# Patient Record
Sex: Female | Born: 1980 | Marital: Single | Smoking: Never smoker
Health system: Southern US, Community
[De-identification: ages and names within clinical notes are randomized; demographics above are authoritative.]

## PROBLEM LIST (undated history)

## (undated) DIAGNOSIS — R87619 Unspecified abnormal cytological findings in specimens from cervix uteri: Secondary | ICD-10-CM

## (undated) DIAGNOSIS — R1900 Intra-abdominal and pelvic swelling, mass and lump, unspecified site: Secondary | ICD-10-CM

## (undated) DIAGNOSIS — N760 Acute vaginitis: Secondary | ICD-10-CM

## (undated) DIAGNOSIS — D649 Anemia, unspecified: Secondary | ICD-10-CM

## (undated) DIAGNOSIS — B9689 Other specified bacterial agents as the cause of diseases classified elsewhere: Secondary | ICD-10-CM

## (undated) HISTORY — DX: Other specified bacterial agents as the cause of diseases classified elsewhere: B96.89

## (undated) HISTORY — DX: Acute vaginitis: N76.0

## (undated) HISTORY — DX: Unspecified abnormal cytological findings in specimens from cervix uteri: R87.619

## (undated) HISTORY — DX: Anemia, unspecified: D64.9

## (undated) HISTORY — DX: Intra-abdominal and pelvic swelling, mass and lump, unspecified site: R19.00

---

## 2011-05-22 ENCOUNTER — Other Ambulatory Visit: Payer: Self-pay | Admitting: Geriatric Medicine

## 2011-05-22 DIAGNOSIS — D219 Benign neoplasm of connective and other soft tissue, unspecified: Secondary | ICD-10-CM

## 2011-05-25 ENCOUNTER — Ambulatory Visit
Admission: RE | Admit: 2011-05-25 | Discharge: 2011-05-25 | Disposition: A | Discharge status: Home / Self Care | Payer: No Typology Code available for payment source | Source: Ambulatory Visit | Attending: Geriatric Medicine | Admitting: Geriatric Medicine

## 2011-05-25 ENCOUNTER — Ambulatory Visit
Admission: RE | Admit: 2011-05-25 | Discharge: 2011-05-25 | Disposition: A | Discharge status: Home / Self Care | Payer: Self-pay | Source: Ambulatory Visit | Attending: Geriatric Medicine | Admitting: Geriatric Medicine

## 2011-05-25 DIAGNOSIS — D219 Benign neoplasm of connective and other soft tissue, unspecified: Secondary | ICD-10-CM

## 2011-05-30 ENCOUNTER — Other Ambulatory Visit: Payer: Self-pay | Admitting: Geriatric Medicine

## 2011-05-30 DIAGNOSIS — R935 Abnormal findings on diagnostic imaging of other abdominal regions, including retroperitoneum: Secondary | ICD-10-CM

## 2011-05-31 ENCOUNTER — Other Ambulatory Visit: Payer: No Typology Code available for payment source

## 2011-06-01 ENCOUNTER — Ambulatory Visit
Admission: RE | Admit: 2011-06-01 | Discharge: 2011-06-01 | Disposition: A | Discharge status: Home / Self Care | Payer: No Typology Code available for payment source | Source: Ambulatory Visit | Attending: Geriatric Medicine | Admitting: Geriatric Medicine

## 2011-06-01 DIAGNOSIS — R935 Abnormal findings on diagnostic imaging of other abdominal regions, including retroperitoneum: Secondary | ICD-10-CM

## 2011-06-01 MED ORDER — IOHEXOL 300 MG/ML  SOLN
100.0000 mL | Freq: Once | INTRAMUSCULAR | Status: AC | PRN
  Administered 2011-06-01: 100 mL via INTRAVENOUS

## 2011-06-02 ENCOUNTER — Other Ambulatory Visit: Payer: Self-pay | Admitting: Geriatric Medicine

## 2011-06-02 DIAGNOSIS — R1900 Intra-abdominal and pelvic swelling, mass and lump, unspecified site: Secondary | ICD-10-CM

## 2011-06-05 ENCOUNTER — Ambulatory Visit
Admission: RE | Admit: 2011-06-05 | Discharge: 2011-06-05 | Disposition: A | Discharge status: Home / Self Care | Payer: Self-pay | Source: Ambulatory Visit | Attending: Geriatric Medicine | Admitting: Geriatric Medicine

## 2011-06-05 DIAGNOSIS — R1900 Intra-abdominal and pelvic swelling, mass and lump, unspecified site: Secondary | ICD-10-CM

## 2011-06-05 MED ORDER — IOHEXOL 300 MG/ML  SOLN
125.0000 mL | Freq: Once | INTRAMUSCULAR | Status: AC | PRN
  Administered 2011-06-05: 125 mL via INTRAVENOUS

## 2011-06-14 ENCOUNTER — Ambulatory Visit (INDEPENDENT_AMBULATORY_CARE_PROVIDER_SITE_OTHER): Payer: Self-pay | Admitting: General Surgery

## 2011-06-14 ENCOUNTER — Encounter (INDEPENDENT_AMBULATORY_CARE_PROVIDER_SITE_OTHER): Payer: Self-pay | Admitting: General Surgery

## 2011-06-14 VITALS — BP 124/82 | HR 66 | Temp 97.8°F | Ht 60.0 in | Wt 123.4 lb

## 2011-06-14 DIAGNOSIS — R1909 Other intra-abdominal and pelvic swelling, mass and lump: Secondary | ICD-10-CM

## 2011-06-14 NOTE — Progress Notes (Signed)
Subjective:     Patient ID: Tracey Hunt, female   DOB: June 08, 1981, 30 y.o.   MRN: [id number]  HPIRight lower abdominal rectus mass found on recent examination for GYN pelvic problems the patient was referred to Dr. Quintella Reichert is soft her for pain some abnormal menstrual period and placed her on antibiotics on abdominal exam there was a fullness on the right rectus muscle compared with the left and the patient thinks that this mass has been present for probably a year she says she's not having any abdominal pain and she's had no previous abdominal surgery she did have a vaginal delivery proximally 6 years ago and nothing was noted on her exam at that time. Dr. Quintella Reichert first order of abdominal CT that did not include this area and then she was called back several days later for a pelvic CT. The pelvic CT shows a mass approximate 6 x 3 cm in the lower right rectus muscle that is definitely asymmetrical compared with the opposite side but it is kind of a fusiform symmetrical area. The patient is referred to [place] for biopsy and I would think a needle biopsy would be the proper method of biopsy. I had Dr. Daphine Deutscher examine the patient and he recommended that we first MRI the patient however has no insurance and associated to use all her siphons for the CT and has a repeat xray MRI really needed  When I asked her to pillow more definite home when she first noticed the area she's now retracted and possibly pneumonia and year but it has not changed very much on examination  Review of SystemsBesides her GYN problems that she is presently and treated for GYN infection she's not had yellow medical problems she is aware of he is presently on Flagyl orally     Objective:   Physical Exam On physical exam she is a pleasant young [place] American female in no acute distress. On exam there is a fullness that's farmer in the right lower rectus that was not pulsate does not feel like a hernia and is not tender she has not noted in  the other similar masses within her muscular to a and I thank that a needle biopsy would be the preferred method of examining the period I cannot find any masses or tenderness in the upper abdomen she said no change in her intestinal function find no evidence of any hernia in the lower abdomen .  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

## 2011-06-14 NOTE — Patient Instructions (Signed)
I will call you after I have reviewed the x-rays with our radiologist

## 2011-07-19 ENCOUNTER — Ambulatory Visit (INDEPENDENT_AMBULATORY_CARE_PROVIDER_SITE_OTHER): Payer: Self-pay | Admitting: General Surgery

## 2011-08-04 ENCOUNTER — Ambulatory Visit (INDEPENDENT_AMBULATORY_CARE_PROVIDER_SITE_OTHER): Payer: Self-pay | Admitting: General Surgery

## 2011-08-25 ENCOUNTER — Encounter (INDEPENDENT_AMBULATORY_CARE_PROVIDER_SITE_OTHER): Payer: Self-pay | Admitting: General Surgery

## 2011-08-25 ENCOUNTER — Ambulatory Visit (INDEPENDENT_AMBULATORY_CARE_PROVIDER_SITE_OTHER): Payer: PRIVATE HEALTH INSURANCE | Admitting: General Surgery

## 2011-08-25 VITALS — BP 130/80 | HR 64 | Temp 98.9°F | Resp 20 | Ht 60.0 in | Wt 120.5 lb

## 2011-08-25 DIAGNOSIS — R1909 Other intra-abdominal and pelvic swelling, mass and lump: Secondary | ICD-10-CM

## 2011-08-25 NOTE — Patient Instructions (Signed)
Return to see me in one month and we will definitely keep copies of this ultrasound and and compare it with a followup ultrasound in one month. The mass is roughly 50% smaller than when I originally examined U. and the CT was performed which would be consistent with this been a resolving hematoma. With no history of trauma trying to be sure its a hematoma it is difficult.

## 2011-08-25 NOTE — Progress Notes (Signed)
Subjective:     Patient ID: Tracey Hunt, female   DOB: [birth year], 30 y.o.   MRN: [id number]  HPIThe patient returns now approximately 6 week after originally saw her when she was found to have a mass in the right rectus muscle was no history of trauma. She was being evaluated for GYN infection and was placed on antibiotics and I have reviewed the x-rays with the [facility] radiologist and they were taken that it was most likely a hematoma if there was any history of trauma. The patient states that she has no history of trauma that she is aware of and was scheduled to see me 2 weeks ago but missed his appointment but does come into the the office today. The mass is not as obvious and is definitely decreased in size and I repeated the ultrasound and I had not saved images one ultrasound her last week and the measurements in comparison to the CT in August measures now approximately 2.3 x 4 where the original cement mass was 2.7 cm by nearly 7 cm but definitely a 50% decrease in size of the area of question. She says she never had any bruise or yellowing of the skin but that the GYN symptoms have improved with taking the antibiotic. She's still get a little urgency and feels like pressure on her bladder if she needs to void but that is better CT does not show any evidence of this mass pushing on her bladder   Review of Systems     Objective:   Physical ExamBP 130/80  Pulse 64  Temp 98.9 F (37.2 C)  Resp 20  Ht 5' (1.524 m)  Wt 120 lb 8 oz (54.658 kg)  BMI 23.53 kg/m2 Examination today U. can still feel the mass when compared to the left rectus muscle but it is definitely much smaller and nontender I repeated the ultrasound and was the size as noted above and I would recommend not proceeding with biopsy which is reexamine her in one month     Assessment:    The patient states that she's wants to change original physicians because her GYN examination and later so sore that she has a little fear of a  repeat pelvic examination. She is not sure whether she'll see a gynecologist or urologist if her symptoms recur     Plan:    Plan followup in our office in one month to lack in repeat the ultrasound of this mass that hopefully is a hematoma that is resolving

## 2011-09-22 ENCOUNTER — Ambulatory Visit (INDEPENDENT_AMBULATORY_CARE_PROVIDER_SITE_OTHER): Payer: PRIVATE HEALTH INSURANCE | Admitting: General Surgery

## 2012-07-29 IMAGING — US US TRANSVAGINAL NON-OB
1 series · 14 of 25 positions shown · non-contrast
Comparison: None.

CLINICAL DATA: Uterine fibroids.

TRANSABDOMINAL AND TRANSVAGINAL ULTRASOUND OF PELVIS
TECHNIQUE: Both transabdominal and transvaginal ultrasound
examinations of the pelvis were performed. Transabdominal technique
was performed for global imaging of the pelvis including uterus,
ovaries, adnexal regions, and pelvic cul-de-sac.

[Series 1: us transvaginal non-ob · 0.28mm/px · 14 of 55 slices shown]
[im 1/55]
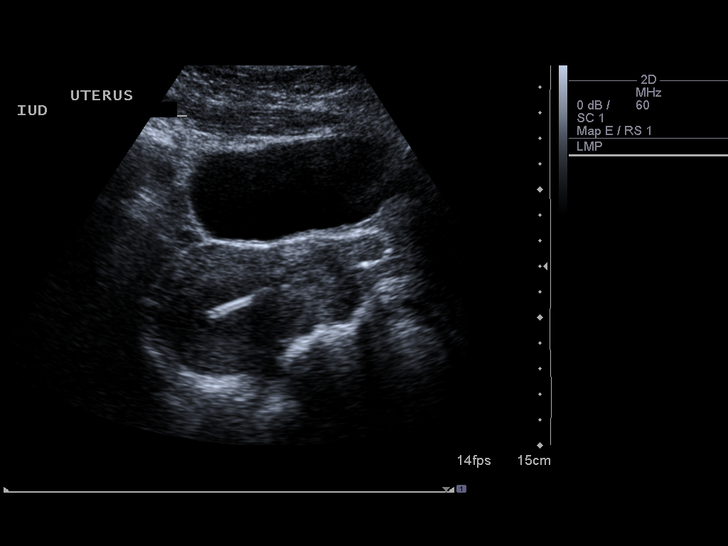
[im 5/55]
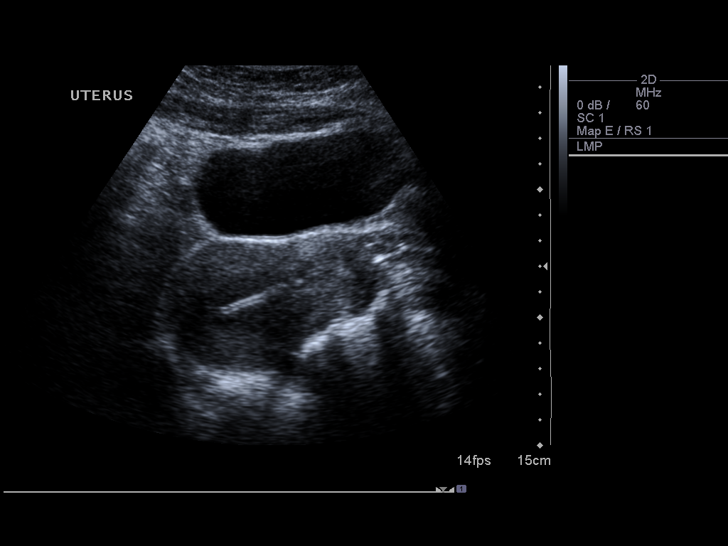
[im 10/55]
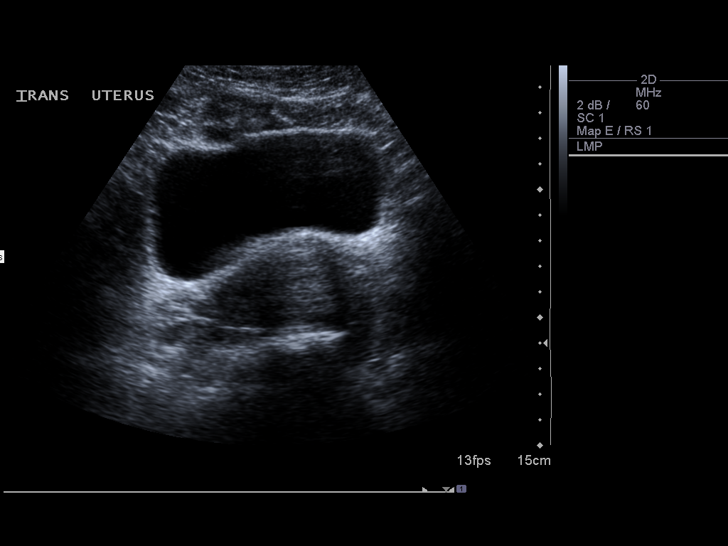
[im 14/55]
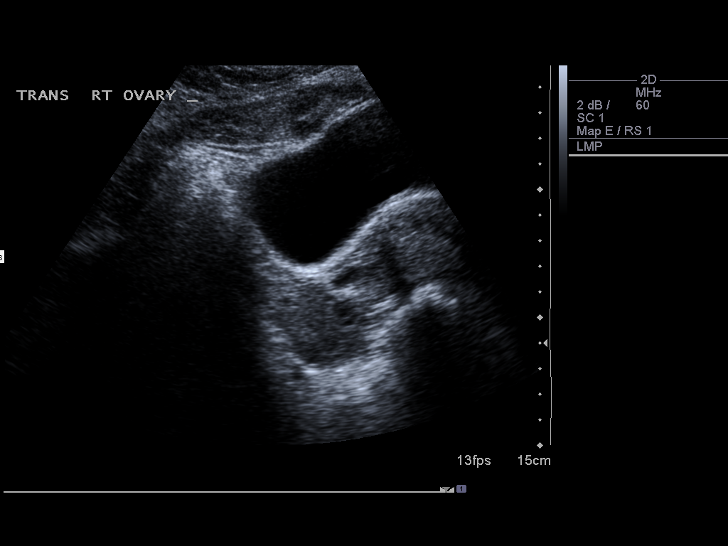
[im 19/55]
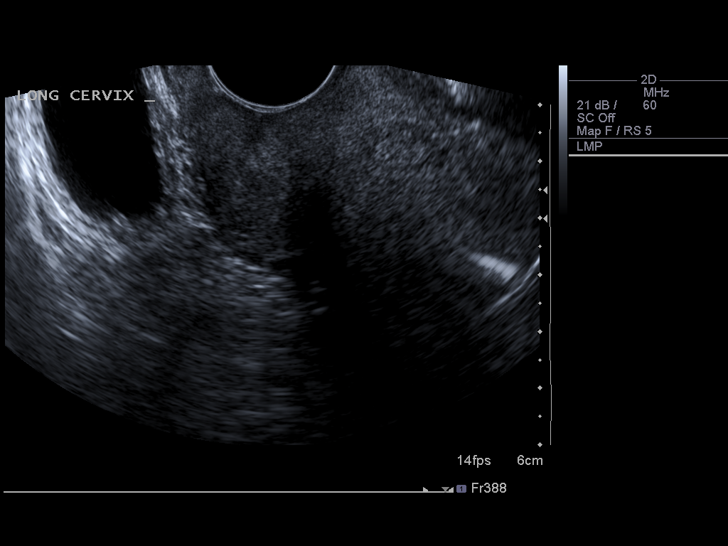
[im 21/55]
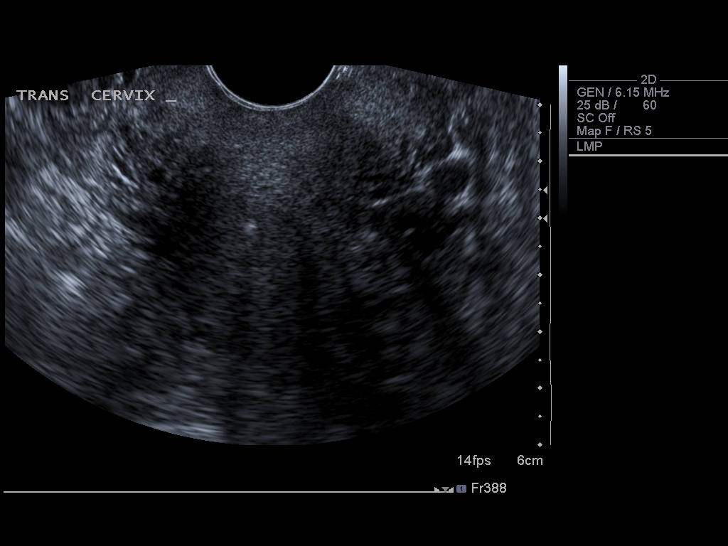
[im 25/55]
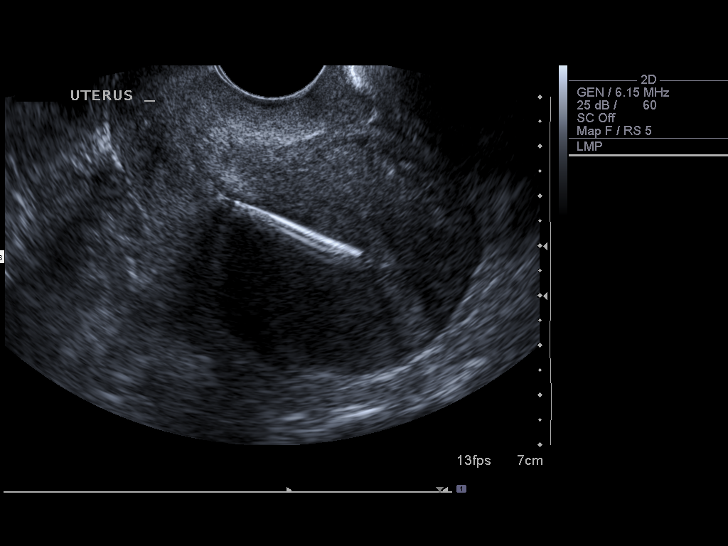
[im 30/55]
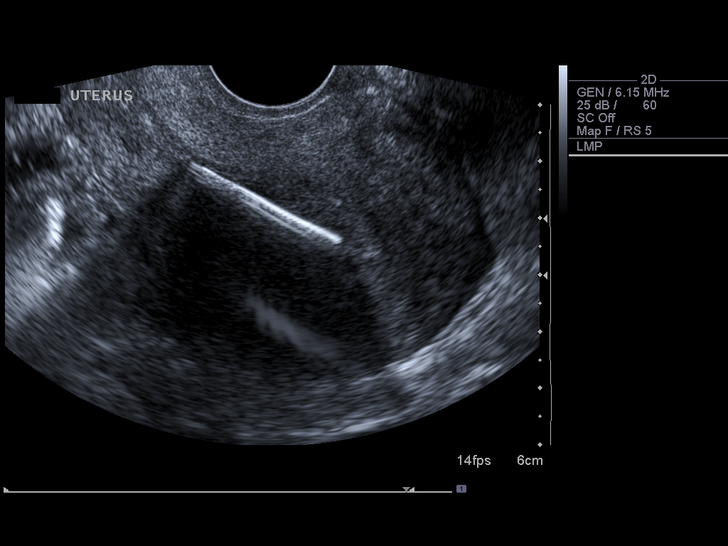
[im 34/55]
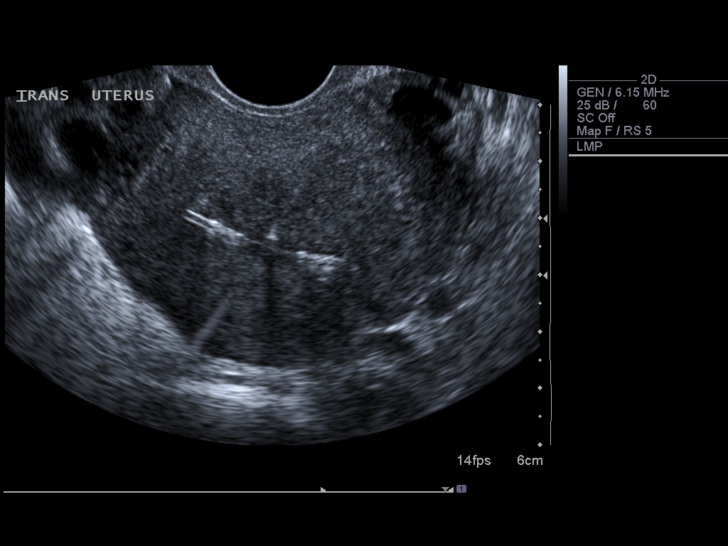
[im 37/55]
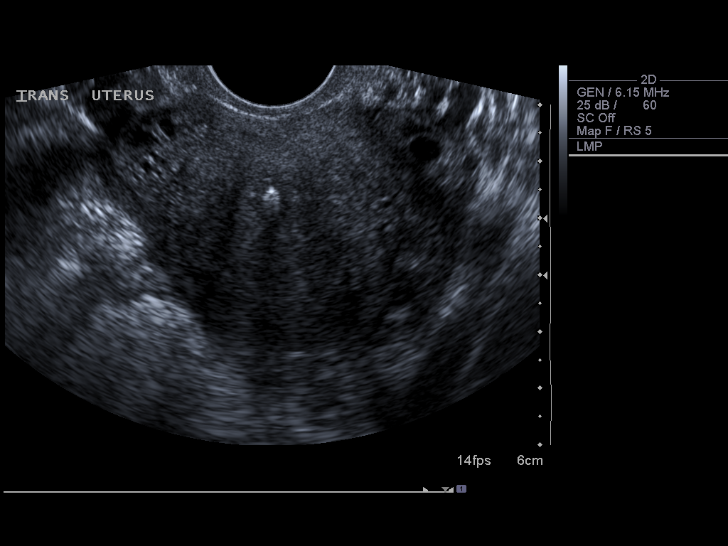
[im 41/55]
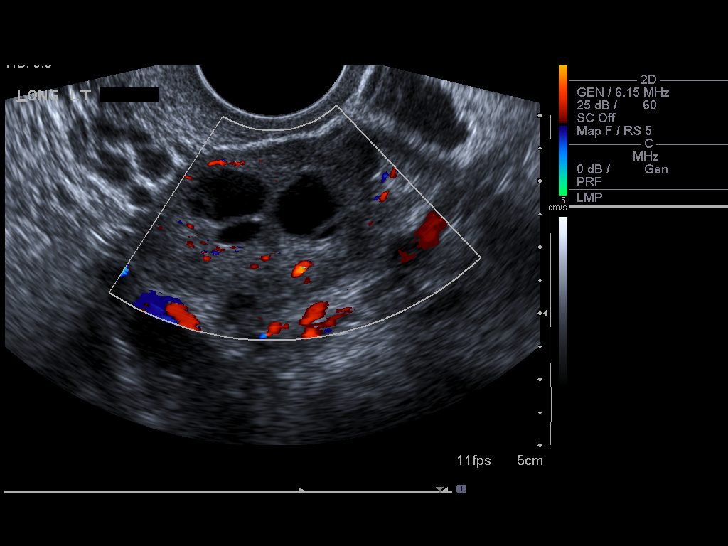
[im 46/55]
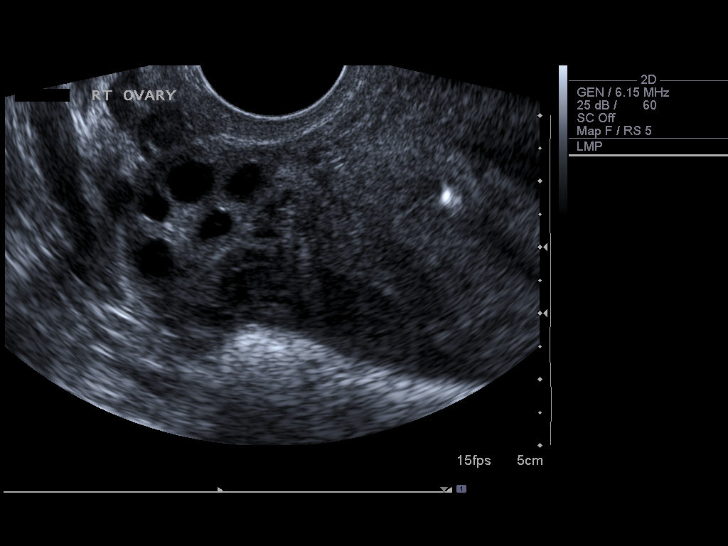
[im 50/55]
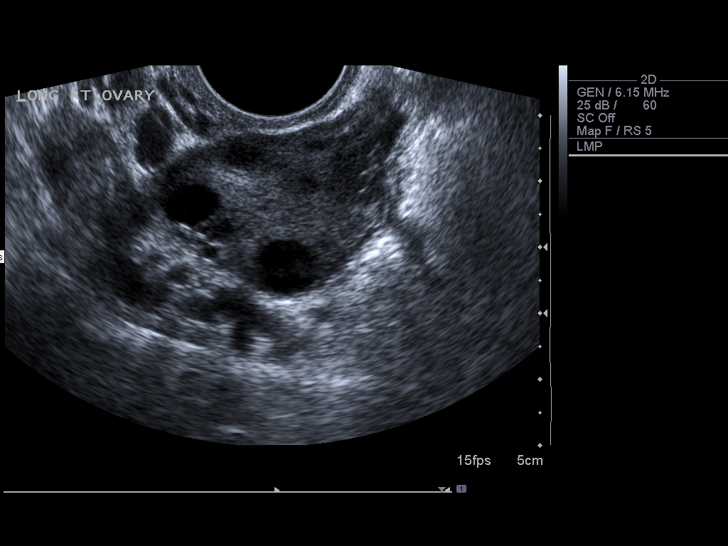
[im 55/55]
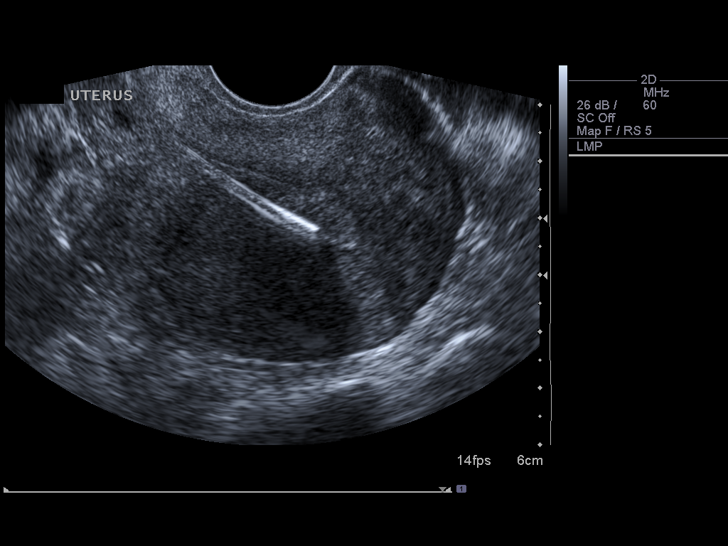

[14 of 25 positions shown; findings below may reference images not displayed]

It was necessary to proceed with endovaginal exam following the
transabdomnial exam to visualize the endometrium and ovaries.
FINDINGS: Uterus: Measures 8.6 x 5.6 x 6.5 cm.  Mild retroversion.  No
uterine fibroids.

Endometrium: IUD in place.  Normal endometrial thickness of 8.3 mm.

Right ovary:  Measures 3.3 x 2.5 x 2.0 cm.  Multiple follicles but
no cysts or masses.

Left ovary: Measures 2.9 x 2.3 x 1.9 cm.  Multiple follicles but no
cysts or masses.

Other findings: No free fluid
IMPRESSION: 1.  IUD in place.
2.  Normal sonographic appearance of the uterus and ovaries.

## 2012-08-05 IMAGING — CT CT ABDOMEN W/ CM
3 of 4 series · 14 of 36 positions shown, 20 images · IV contrast (READICAT/WATER & [ID] OMNI 300)
Comparison: Ultrasound 05/25/2011

CLINICAL DATA: Concern for abdominal mass.

CT ABDOMEN WITH CONTRAST
TECHNIQUE: Multidetector CT imaging of the abdomen was performed
using the standard protocol following bolus administration of
intravenous contrast.
Contrast: 100 ml Omnipaque 300

[Series 3: routine abdomen · axial · 0.69mm/px · z∈[-224,-44]mm · 6 of 52 slices shown, 11 images]
[im 8/52  soft-tissue]
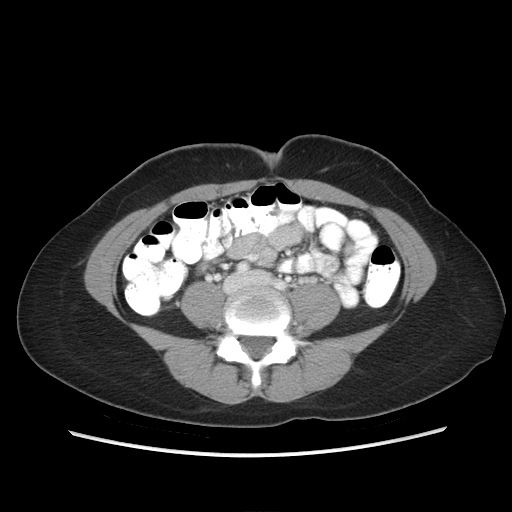
[im 8/52  bone]
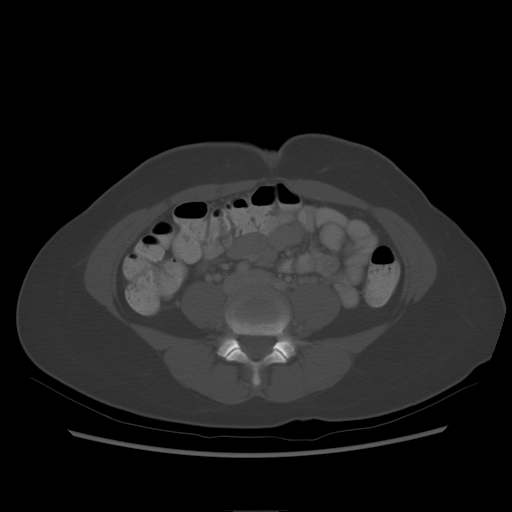
[im 15/52  soft-tissue]
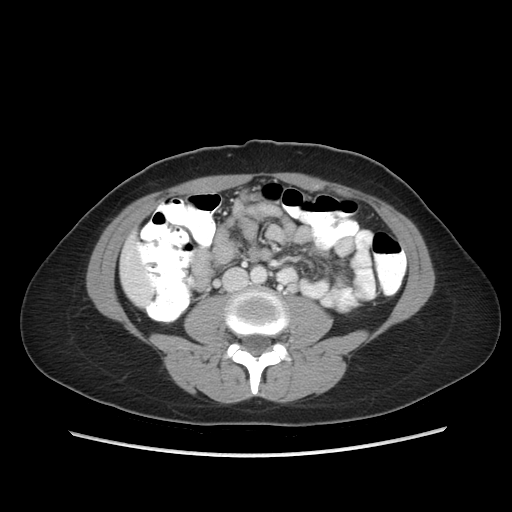
[im 22/52  soft-tissue]
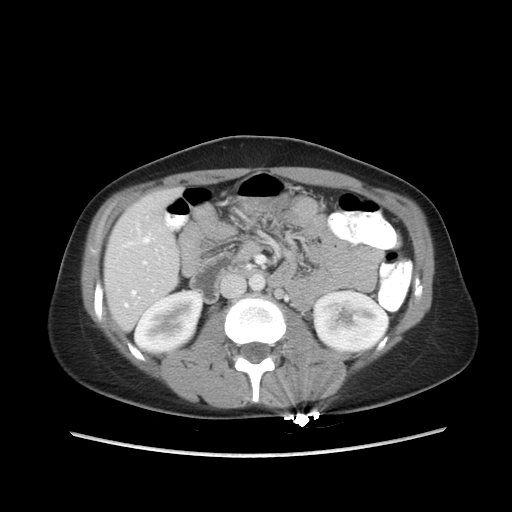
[im 22/52  lung]
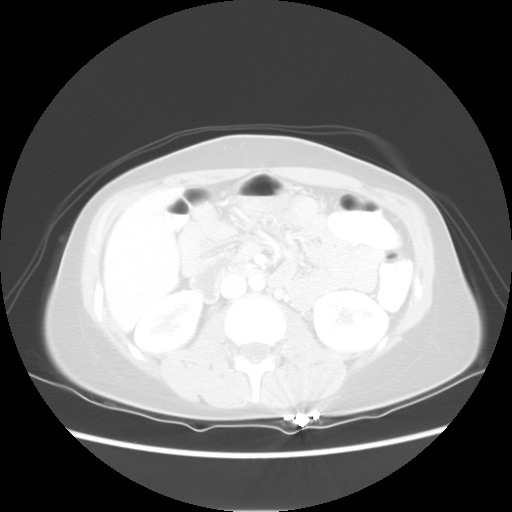
[im 30/52  soft-tissue]
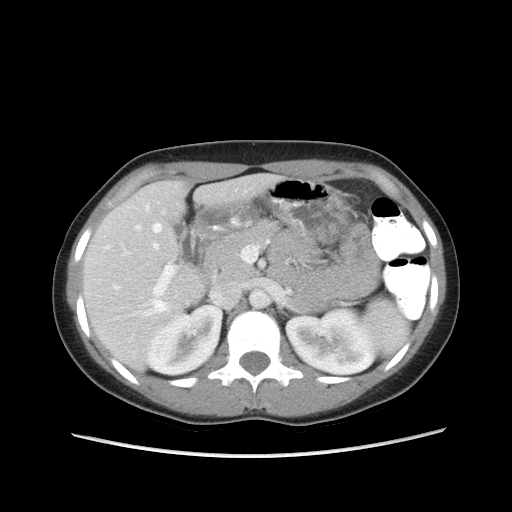
[im 30/52  lung]
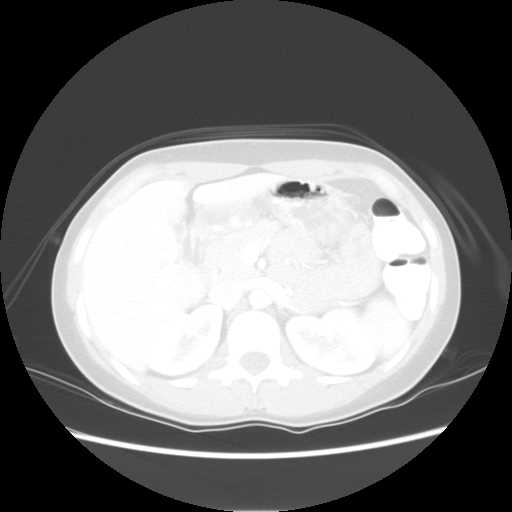
[im 37/52  soft-tissue]
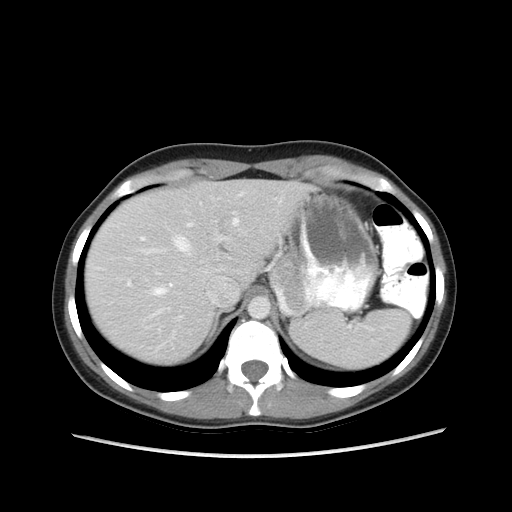
[im 37/52  lung]
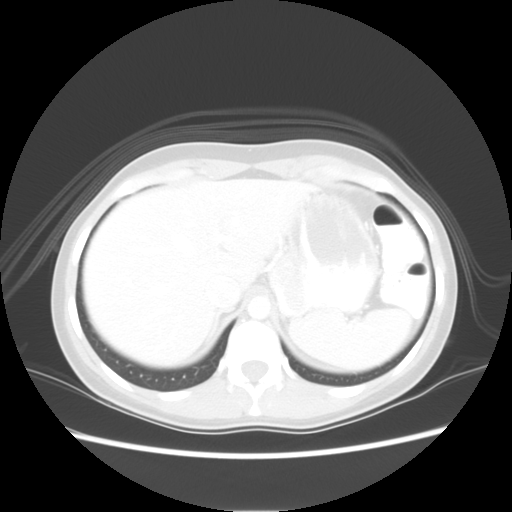
[im 44/52  soft-tissue]
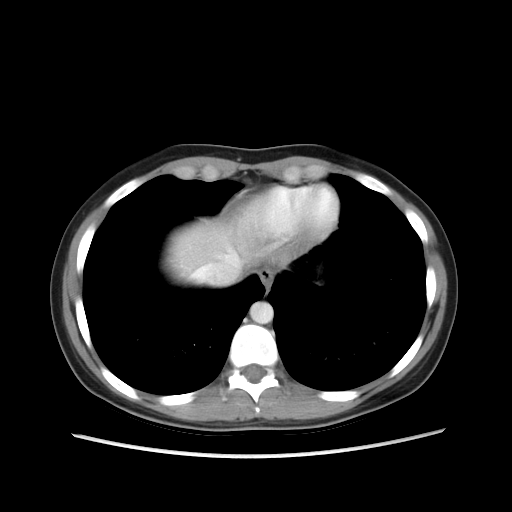
[im 44/52  lung]
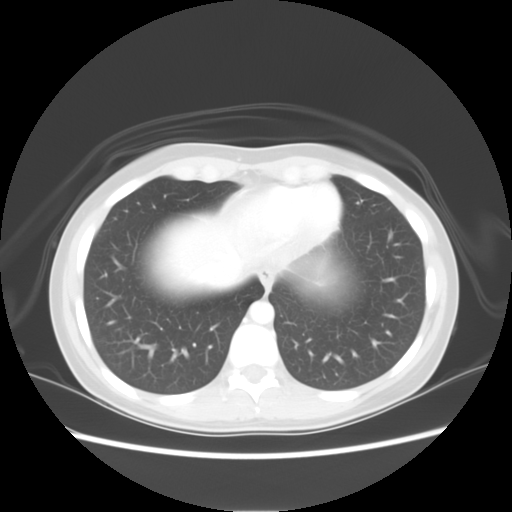

[Series 601: coronal body · coronal · 0.69mm/px · 1 of 91 slices shown, 2 images]
[im 31/91  soft-tissue]
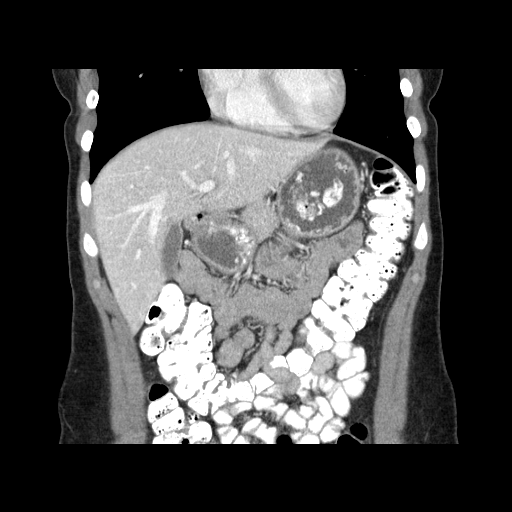
[im 31/91  bone]
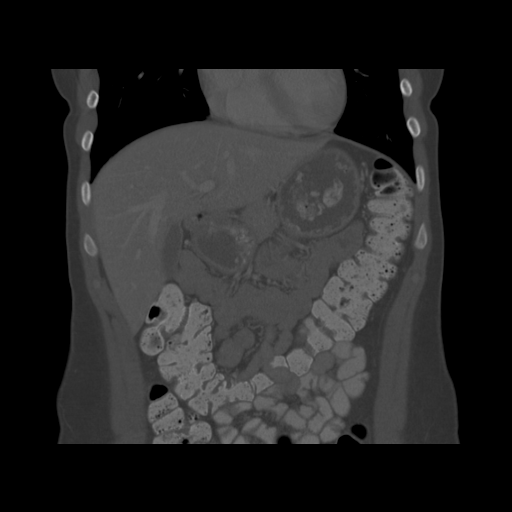

[Series 602: sagittal body · sagittal · 0.69mm/px · 7 of 137 slices shown]
[im 15/137  soft-tissue]
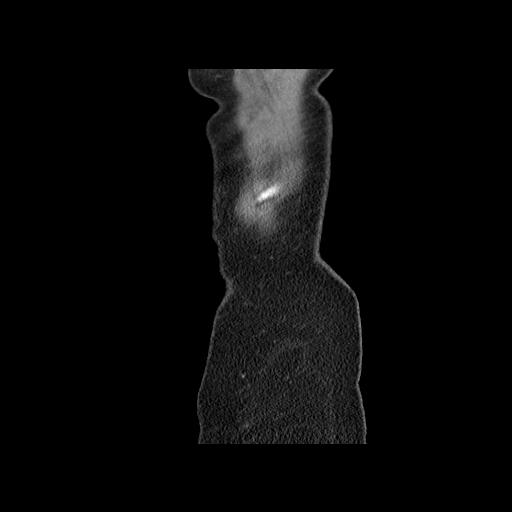
[im 29/137  soft-tissue]
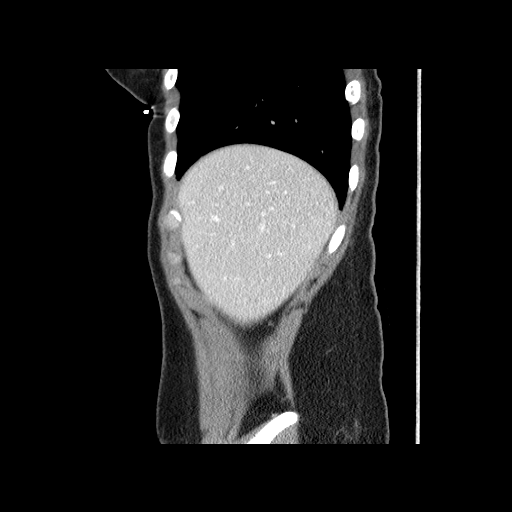
[im 43/137  soft-tissue]
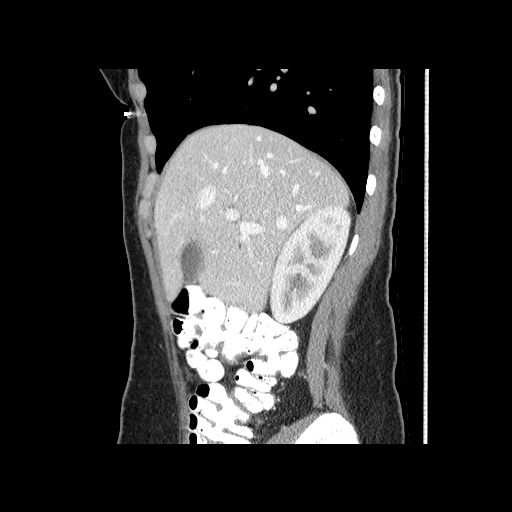
[im 58/137  soft-tissue]
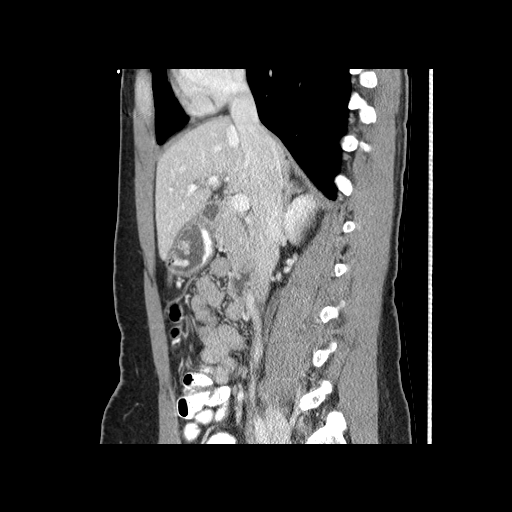
[im 79/137  soft-tissue]
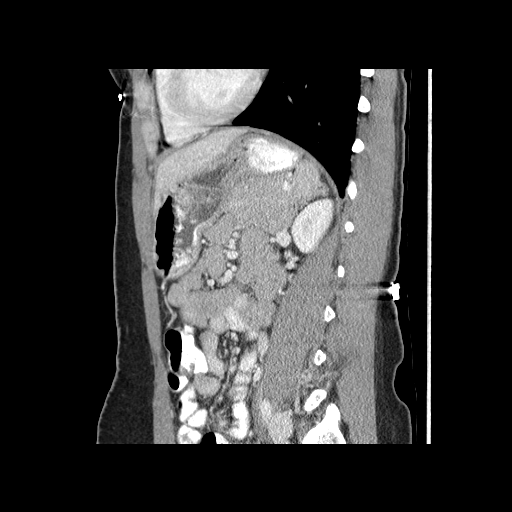
[im 94/137  soft-tissue]
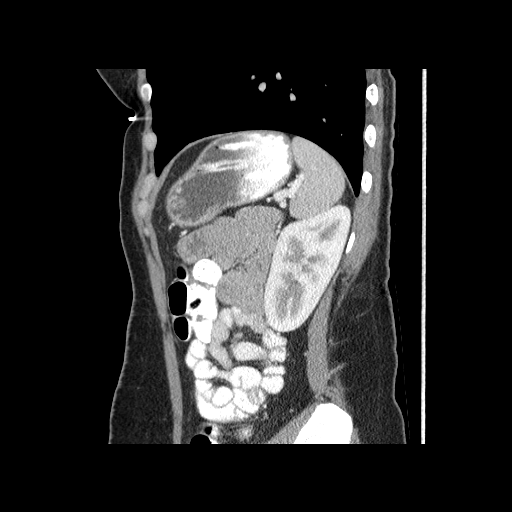
[im 108/137  soft-tissue]
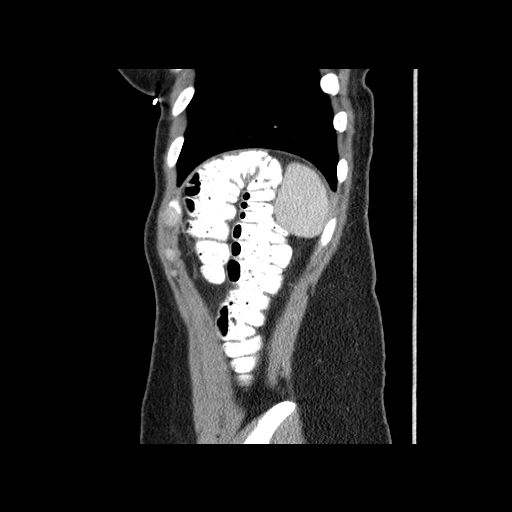

[14 of 36 positions shown; findings below may reference images not displayed]

FINDINGS: Lung bases are clear.

No focal hepatic lesion.  The gallbladder, pancreas, spleen,
adrenal glands, and kidneys are normal.

The stomach and limited view of the small bowel and colon appear
normal.  Abdominal aorta is normal caliber.  No retroperitoneal
periportal adenopathy.

Limited view of the skeleton is unremarkable.
IMPRESSION: Normal abdominal CT.

## 2012-09-20 ENCOUNTER — Telehealth (INDEPENDENT_AMBULATORY_CARE_PROVIDER_SITE_OTHER): Payer: Self-pay | Admitting: General Surgery

## 2012-09-20 NOTE — Telephone Encounter (Signed)
Pt called to ask how to proceed.  She is a remote pt of Dr. Zachery Dakins, who was seen for an abdominal mass---supposed at that time to be a hematoma.  (Last seen October 2012.)  Pt states she can still feel the mass and would like it followed up.  Strongly advised she make appt with PCP for a current work up.  If a surgeon is indicated, the PCP can contact [facility] for the referral.  Pt understands and will comply.

## 2012-11-06 HISTORY — PX: LAPAROTOMY: SHX154

## 2015-08-11 ENCOUNTER — Ambulatory Visit (INDEPENDENT_AMBULATORY_CARE_PROVIDER_SITE_OTHER): Payer: BLUE CROSS/BLUE SHIELD | Admitting: Obstetrics & Gynecology

## 2015-08-11 ENCOUNTER — Encounter: Payer: Self-pay | Admitting: Obstetrics & Gynecology

## 2015-08-11 ENCOUNTER — Other Ambulatory Visit (HOSPITAL_COMMUNITY): Payer: Self-pay | Admitting: Obstetrics & Gynecology

## 2015-08-11 VITALS — BP 147/93 | HR 94 | Resp 16 | Ht 60.0 in | Wt 120.0 lb

## 2015-08-11 DIAGNOSIS — Z01419 Encounter for gynecological examination (general) (routine) without abnormal findings: Secondary | ICD-10-CM

## 2015-08-11 DIAGNOSIS — Z113 Encounter for screening for infections with a predominantly sexual mode of transmission: Secondary | ICD-10-CM | POA: Diagnosis not present

## 2015-08-11 DIAGNOSIS — Z124 Encounter for screening for malignant neoplasm of cervix: Secondary | ICD-10-CM

## 2015-08-11 DIAGNOSIS — Z1151 Encounter for screening for human papillomavirus (HPV): Secondary | ICD-10-CM | POA: Diagnosis not present

## 2015-08-11 DIAGNOSIS — I159 Secondary hypertension, unspecified: Secondary | ICD-10-CM

## 2015-08-11 LAB — TSH: TSH: 2.669 u[IU]/mL (ref 0.350–4.500)

## 2015-08-11 LAB — CBC
HEMATOCRIT: 38.5 % (ref 36.0–46.0)
HEMOGLOBIN: 12.5 g/dL (ref 12.0–15.0)
MCH: 25.4 pg — ABNORMAL LOW (ref 26.0–34.0)
MCHC: 32.5 g/dL (ref 30.0–36.0)
MCV: 78.3 fL (ref 78.0–100.0)
MPV: 11 fL (ref 8.6–12.4)
Platelets: 301 10*3/uL (ref 150–400)
RBC: 4.92 MIL/uL (ref 3.87–5.11)
RDW: 15.8 % — ABNORMAL HIGH (ref 11.5–15.5)
WBC: 8.6 10*3/uL (ref 4.0–10.5)

## 2015-08-11 NOTE — Progress Notes (Signed)
Subjective:    Tracey Hunt is a 34 y.o.  M H P74 ( 21 yo daughter) female who presents for an annual exam. The patient has no complaints today except that she feels like she always has some sort of infection when she gets a pap smear (usually done at the [facility]) The patient is sexually active. GYN screening history: last pap: was normal. The patient wears seatbelts: yes. The patient participates in regular exercise: no. Has the patient ever been transfused or tattooed?: no. The patient reports that there is not domestic violence in her life.   Menstrual History: OB History    Gravida Para Term Preterm AB TAB SAB Ectopic Multiple Living   2 2 1       1       Menarche age: 40  Patient's last menstrual period was 07/22/2015.    The following portions of the patient's history were reviewed and updated as appropriate: allergies, current medications, past family history, past medical history, past social history, past surgical history and problem list.  Review of Systems Pertinent items noted in HPI and remainder of comprehensive ROS otherwise negative. She is in school at [employer], [place]. Declines a flu vaccine. No breast/gyn/colon cancer. +DM, Married for 4 years, uses condoms for contraception. Periods are 4 days, monthly  She reports that she is always anemic.   Objective:    BP 147/93 mmHg  Pulse 94  Resp 16  Ht 5' (1.524 m)  Wt 120 lb (54.432 kg)  BMI 23.44 kg/m2  LMP 07/22/2015  General Appearance:    Alert, cooperative, no distress, appears stated age  Head:    Normocephalic, without obvious abnormality, atraumatic  Eyes:    PERRL, conjunctiva/corneas clear, EOM's intact, fundi    benign, both eyes  Ears:    Normal TM's and external ear canals, both ears  Nose:   Nares normal, septum midline, mucosa normal, no drainage    or sinus tenderness  Throat:   Lips, mucosa, and tongue normal; teeth and gums normal  Neck:   Supple, symmetrical, trachea midline, no  adenopathy;    thyroid:  no enlargement/tenderness/nodules; no carotid   bruit or JVD  Back:     Symmetric, no curvature, ROM normal, no CVA tenderness  Lungs:     Clear to auscultation bilaterally, respirations unlabored  Chest Wall:    No tenderness or deformity   Heart:    Regular rate and rhythm, S1 and S2 normal, no murmur, rub   or gallop  Breast Exam:    No tenderness, masses, or nipple abnormality  Abdomen:     Soft, non-tender, bowel sounds active all four quadrants,    no masses, no organomegaly, vertical scar (She is unsure what "mass" she had removed  Genitalia:    Normal female without lesion, discharge or tenderness, NSSR, NT, mobile, normal adnexal exam     Extremities:   Extremities normal, atraumatic, no cyanosis or edema  Pulses:   2+ and symmetric all extremities  Skin:   Skin color, texture, turgor normal, no rashes or lesions  Lymph nodes:   Cervical, supraclavicular, and axillary nodes normal  Neurologic:   CNII-XII intact, normal strength, sensation and reflexes    throughout  .    Assessment:    Healthy female exam.   High Blood Pressure H/o anemia   Plan:     Breast self exam technique reviewed and patient encouraged to perform self-exam monthly. Thin prep Pap smear. Urinalysis. Wet  prep.   Refer to FP Check CBC, TSH

## 2015-08-13 LAB — CYTOLOGY - PAP

## 2015-08-13 LAB — GC/CHLAMYDIA PROBE AMP
CT PROBE, AMP APTIMA: NEGATIVE
GC PROBE AMP APTIMA: NEGATIVE

## 2015-08-13 LAB — URINE CULTURE
COLONY COUNT: NO GROWTH
ORGANISM ID, BACTERIA: NO GROWTH

## 2015-08-17 LAB — CERVICOVAGINAL ANCILLARY ONLY
Bacterial vaginitis: NEGATIVE
Candida vaginitis: NEGATIVE
